# Patient Record
Sex: Female | Born: 2000
Health system: Southern US, Community
[De-identification: ages and names within clinical notes are randomized; demographics above are authoritative.]

## PROBLEM LIST (undated history)

## (undated) DIAGNOSIS — F419 Anxiety disorder, unspecified: Secondary | ICD-10-CM

---
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
## 2020-04-30 IMAGING — CR DG ANKLE COMPLETE 3+V*L*
3 series · 3 of 3 positions shown · non-contrast
Comparison: None.

CLINICAL DATA: Fall, twisted left ankle

EXAM:
LEFT ANKLE COMPLETE - 3+ VIEW

[t ankle joint ap left]
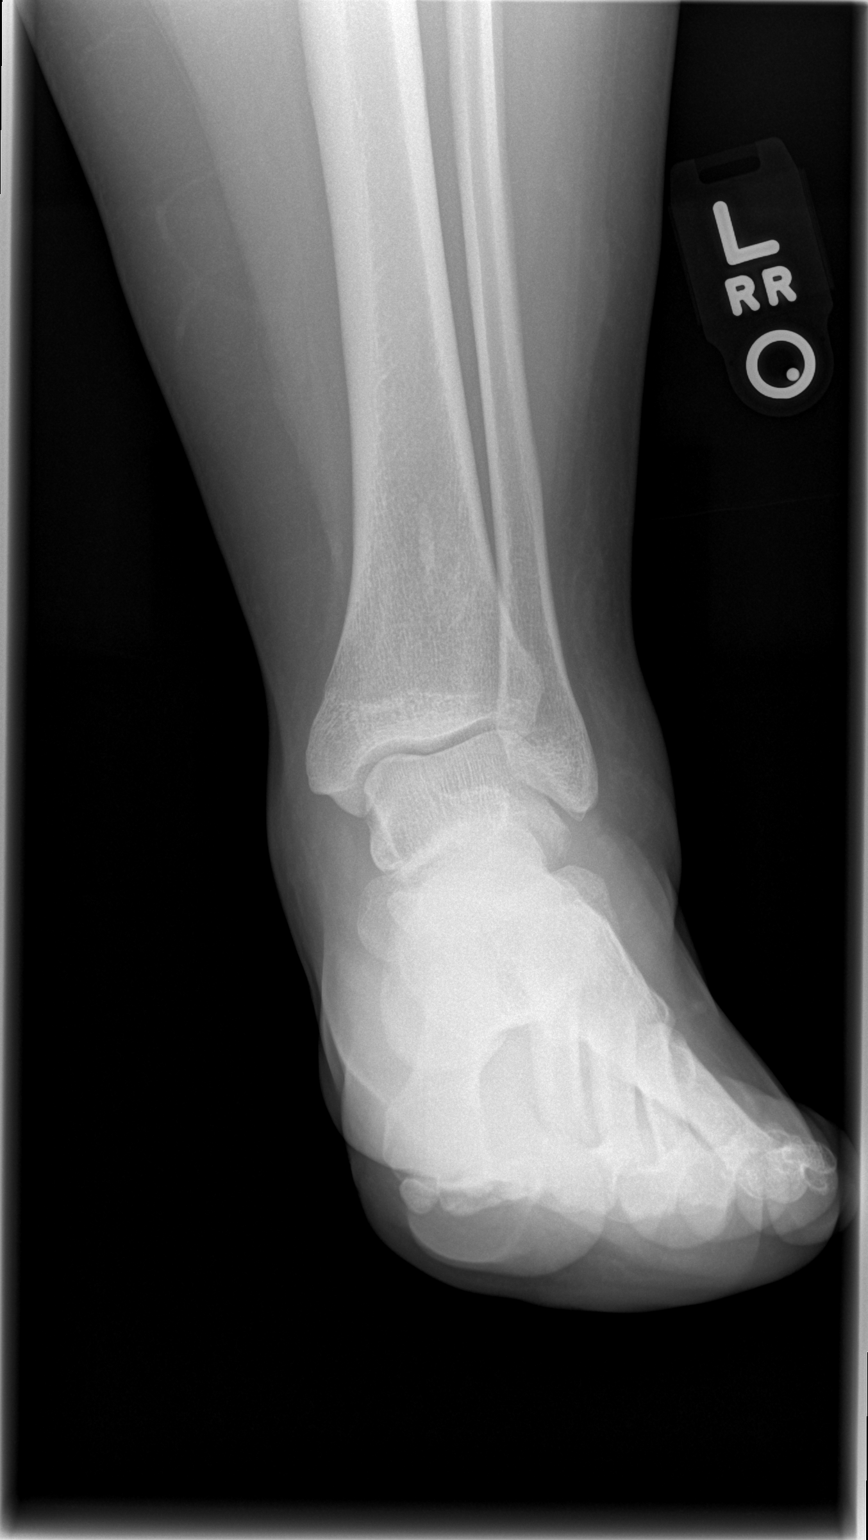

[t ankle joint oblique left]
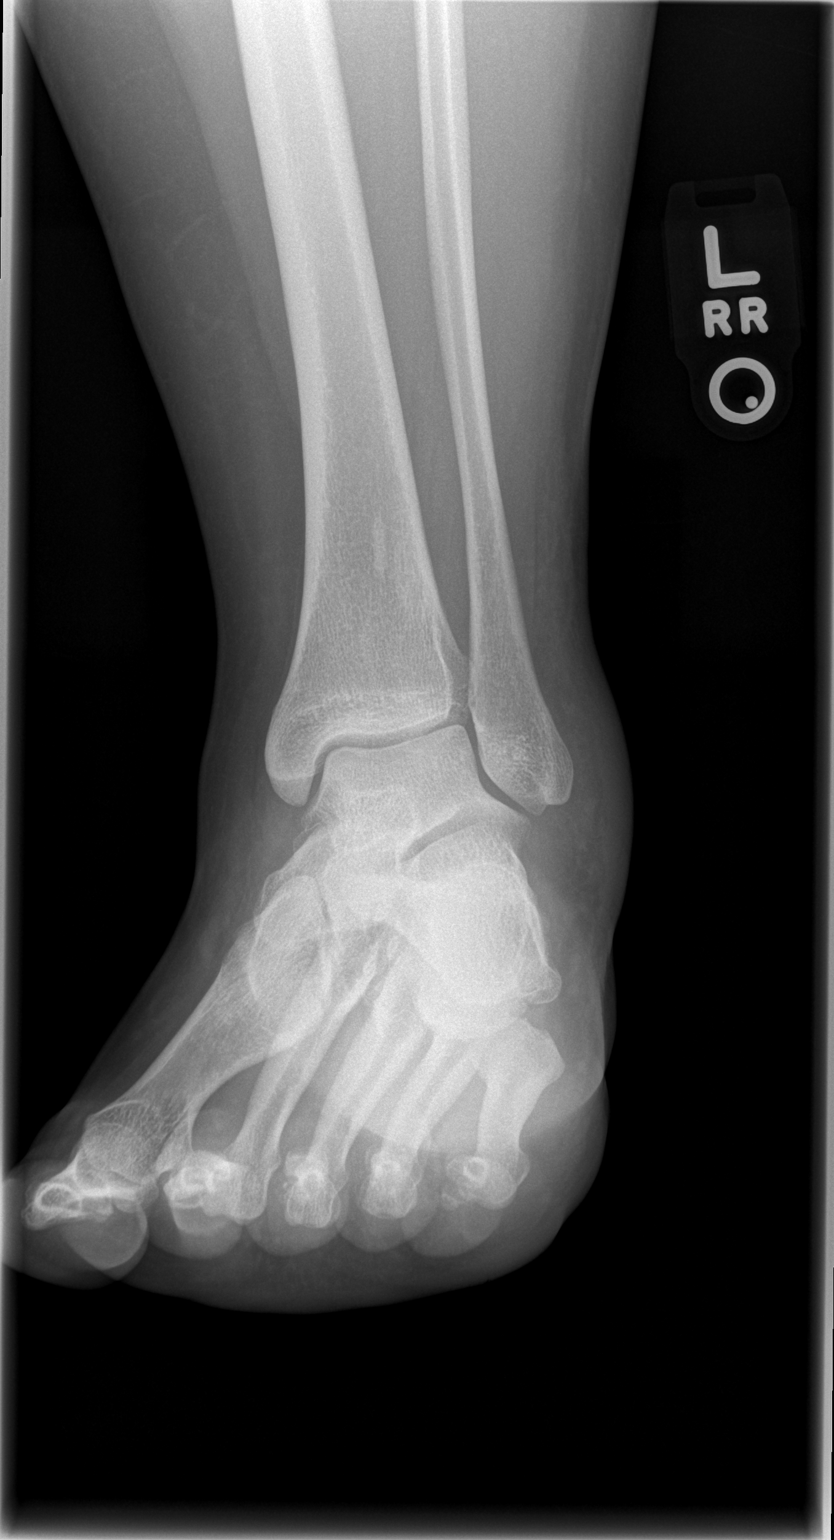

[t ankle joint lat left]
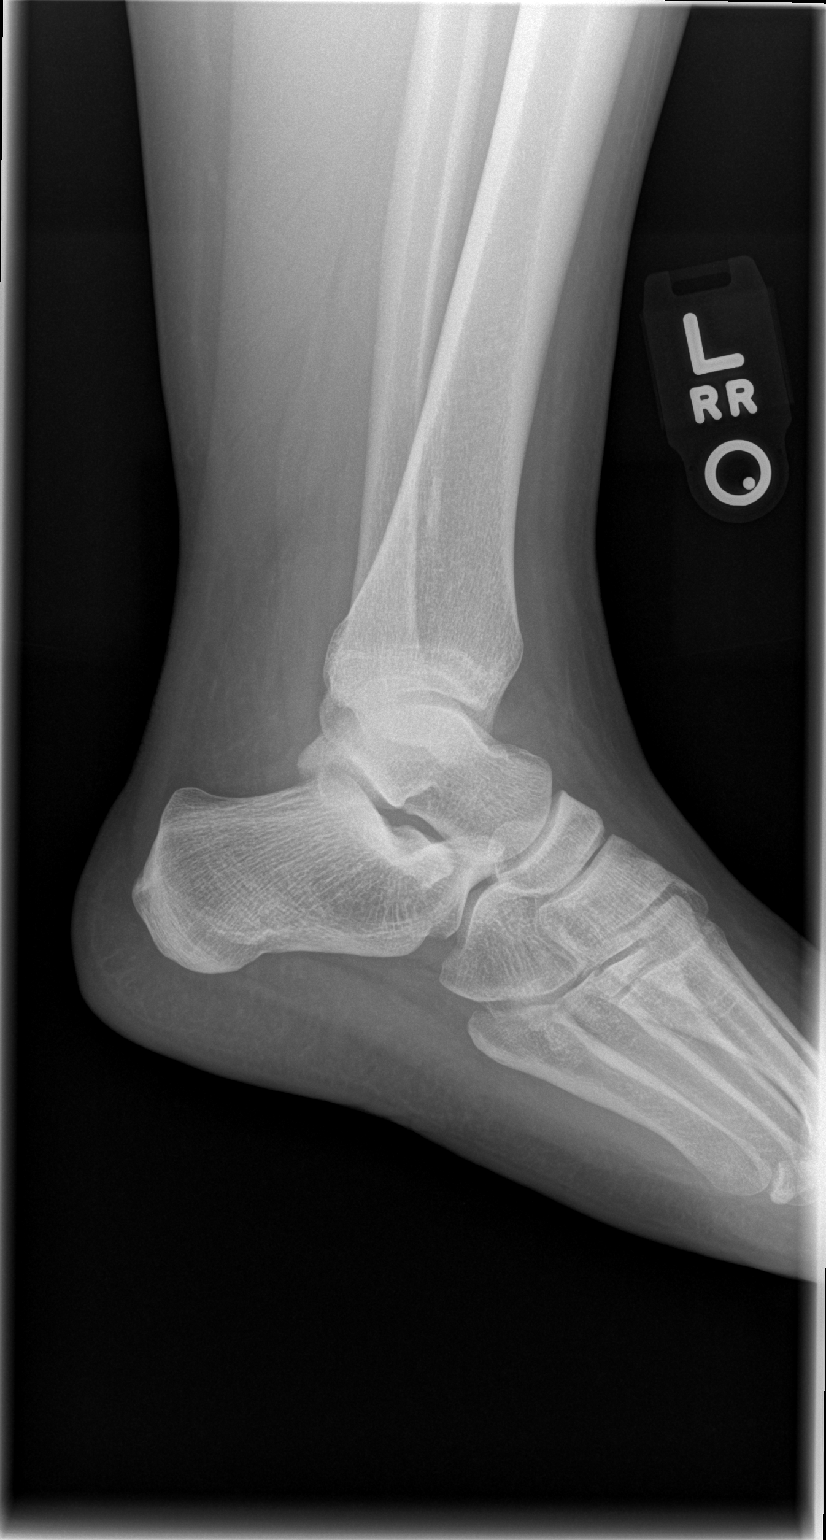

[3 of 3 positions shown; findings below may reference images not displayed]

FINDINGS: There is circumferential ankle swelling most pronounced over the
lateral malleolus with trace ankle joint effusion. No acute bony
abnormality. Specifically, no fracture, subluxation, or dislocation.
IMPRESSION: Circumferential ankle swelling most pronounced laterally with trace
ankle joint effusion. No acute osseous abnormality.
